# Patient Record
Sex: Female | Born: 1990 | Race: Black or African American | Hispanic: No | Marital: Single | Smoking: Never smoker
Health system: Southern US, Community
[De-identification: ages and names within clinical notes are randomized; demographics above are authoritative.]

## PROBLEM LIST (undated history)

## (undated) DIAGNOSIS — L732 Hidradenitis suppurativa: Secondary | ICD-10-CM

---

## 2009-10-28 HISTORY — PX: OTHER SURGICAL HISTORY: SHX169

## 2014-05-19 ENCOUNTER — Encounter (HOSPITAL_COMMUNITY): Payer: Self-pay | Admitting: Emergency Medicine

## 2014-05-19 ENCOUNTER — Emergency Department (HOSPITAL_COMMUNITY)
Admission: EM | Admit: 2014-05-19 | Discharge: 2014-05-19 | Disposition: A | Discharge status: Home / Self Care | Payer: BC Managed Care – PPO | Attending: Emergency Medicine | Admitting: Emergency Medicine

## 2014-05-19 DIAGNOSIS — L509 Urticaria, unspecified: Secondary | ICD-10-CM | POA: Insufficient documentation

## 2014-05-19 DIAGNOSIS — R0989 Other specified symptoms and signs involving the circulatory and respiratory systems: Secondary | ICD-10-CM | POA: Insufficient documentation

## 2014-05-19 DIAGNOSIS — R0609 Other forms of dyspnea: Secondary | ICD-10-CM | POA: Insufficient documentation

## 2014-05-19 DIAGNOSIS — T7840XA Allergy, unspecified, initial encounter: Secondary | ICD-10-CM

## 2014-05-19 DIAGNOSIS — Z79899 Other long term (current) drug therapy: Secondary | ICD-10-CM | POA: Insufficient documentation

## 2014-05-19 DIAGNOSIS — R1310 Dysphagia, unspecified: Secondary | ICD-10-CM | POA: Insufficient documentation

## 2014-05-19 DIAGNOSIS — R21 Rash and other nonspecific skin eruption: Secondary | ICD-10-CM | POA: Insufficient documentation

## 2014-05-19 DIAGNOSIS — T498X5A Adverse effect of other topical agents, initial encounter: Secondary | ICD-10-CM | POA: Insufficient documentation

## 2014-05-19 DIAGNOSIS — J029 Acute pharyngitis, unspecified: Secondary | ICD-10-CM | POA: Insufficient documentation

## 2014-05-19 MED ORDER — PREDNISONE 20 MG PO TABS
60.0000 mg | ORAL_TABLET | Freq: Once | ORAL | Status: AC
  Administered 2014-05-19: 60 mg via ORAL
  Filled 2014-05-19: qty 3

## 2014-05-19 MED ORDER — PREDNISONE 20 MG PO TABS: 40.0000 mg | ORAL_TABLET | Freq: Once | ORAL | Status: DC

## 2014-05-19 MED ORDER — FAMOTIDINE 20 MG PO TABS
20.0000 mg | ORAL_TABLET | Freq: Once | ORAL | Status: AC
  Administered 2014-05-19: 20 mg via ORAL
  Filled 2014-05-19: qty 1

## 2014-05-19 MED ORDER — FAMOTIDINE 20 MG PO TABS: 20.0000 mg | ORAL_TABLET | Freq: Two times a day (BID) | ORAL | Status: DC

## 2014-05-19 NOTE — ED Notes (Addendum)
Pt reports having generalized rash that started two days ago. Pt reports the rash has decreased, however it is now on the feet, hands, and arms. Pt also reports a sore throat. Pt is A/O x4, in NAD,and vitals are WDL. Pt reports itching makes the rash and associated pain worse. Pt reports taking benadryl, however the effect wears off.

## 2014-05-19 NOTE — ED Provider Notes (Signed)
CSN: [id number]     Arrival date & time 05/19/14  2024 History  This chart was scribed for Earley FavorGail Sherrel Shafer, NP, working with Toy BakerAnthony T Allen, MD by Chestine SporeSoijett Blue, ED Scribe. The patient was seen in room WTR6/WTR6 at 9:13 PM.     Chief Complaint  Patient presents with  . Rash  . Sore Throat     The history is provided by the patient. No language interpreter was used.   Mary HawsRachel Arnold is a 23 y.o. female who was brought in by parents to the ED complaining of a rash onset 2 days ago. She states that they will go away but when she scratches them, they come back. She states that she has not tried any new laundry detergent, makeup, or any new items in her daily life. She states that the rash had decreased, but it is now on her feet, hands, and arms. She states that itching makes the rash and the pain worse. She states that she is also having a sore throat. She states that she is having associated symptoms of trouble swallowing and difficulty breathing. She states that she has taken Benadryl with no relief for her symptoms. She states that she took IBU 2 nights ago for her recurring boils that she has. She states that she took more IBU last night and the hives came back. She denies any other associated symptoms.   History reviewed. No pertinent past medical history. History reviewed. No pertinent past surgical history. No family history on file. History  Substance Use Topics  . Smoking status: Never Smoker   . Smokeless tobacco: Never Used  . Alcohol Use: No   OB History   Grav Para Term Preterm Abortions TAB SAB Ect Mult Living                 Review of Systems  HENT: Positive for sore throat and trouble swallowing.   Respiratory:       Difficulty breathing.  Skin: Positive for rash (feet, hands, and arms).  All other systems reviewed and are negative.    Allergies  Ibuprofen  Home Medications   Prior to Admission medications   Medication Sig Start Date End Date Taking? Authorizing  Provider  diphenhydrAMINE (BENADRYL) 25 MG tablet Take 25 mg by mouth every 6 (six) hours as needed (hives).   Yes Historical Provider, MD  ibuprofen (ADVIL,MOTRIN) 200 MG tablet Take 400 mg by mouth every 6 (six) hours as needed (pain).   Yes Historical Provider, MD  levonorgestrel-ethinyl estradiol (AVIANE) 0.1-20 MG-MCG tablet Take 1 tablet by mouth daily.   Yes Historical Provider, MD   BP 140/78  Pulse 98  Temp(Src) 98.4 F (36.9 C) (Oral)  Resp 18  SpO2 100%  LMP 05/05/2014  Physical Exam  Nursing note and vitals reviewed. Constitutional: She is oriented to person, place, and time. She appears well-developed and well-nourished. No distress.  HENT:  Head: Normocephalic and atraumatic.  Eyes: EOM are normal.  Neck: Neck supple. No tracheal deviation present.  Cardiovascular: Normal rate.   Pulmonary/Chest: Effort normal. No respiratory distress.  Musculoskeletal: Normal range of motion.  Neurological: She is alert and oriented to person, place, and time.  Skin: Skin is warm and dry. Rash noted.  Diffused generalized Urticaria present.   Psychiatric: She has a normal mood and affect. Her behavior is normal.    ED Course  Procedures (including critical care time) DIAGNOSTIC STUDIES: Oxygen Saturation is 100% on room air, normal by my interpretation.  COORDINATION OF CARE: 9:19 PM-Discussed treatment plan which includes Prednisone and Pepcid with pt at bedside and pt agreed to plan.   Labs Review Labs Reviewed - No data to display  Imaging Review No results found.   EKG Interpretation None      MDM  Will give Rx for Prednisone and Pepcid for suspected allergic reaction to Ibuprofen  Final diagnoses:  None       I personally performed the services described in this documentation, which was scribed in my presence. The recorded information has been reviewed and is accurate.    Arman Filter, NP 05/19/14 2140

## 2014-05-19 NOTE — ED Provider Notes (Signed)
Medical screening examination/treatment/procedure(s) were performed by non-physician practitioner and as supervising physician I was immediately available for consultation/collaboration.  Chaos Carlile T Offie Waide, MD 05/19/14 2141 

## 2016-04-16 ENCOUNTER — Encounter (HOSPITAL_COMMUNITY): Payer: Self-pay | Admitting: Emergency Medicine

## 2016-04-16 ENCOUNTER — Other Ambulatory Visit: Payer: Self-pay

## 2016-04-16 ENCOUNTER — Emergency Department (HOSPITAL_COMMUNITY): Payer: BC Managed Care – PPO

## 2016-04-16 ENCOUNTER — Emergency Department (HOSPITAL_COMMUNITY)
Admission: EM | Admit: 2016-04-16 | Discharge: 2016-04-17 | Disposition: A | Discharge status: Home / Self Care | Payer: BC Managed Care – PPO | Attending: Emergency Medicine | Admitting: Emergency Medicine

## 2016-04-16 DIAGNOSIS — D649 Anemia, unspecified: Secondary | ICD-10-CM | POA: Insufficient documentation

## 2016-04-16 DIAGNOSIS — R0602 Shortness of breath: Secondary | ICD-10-CM | POA: Diagnosis present

## 2016-04-16 HISTORY — DX: Hidradenitis suppurativa: L73.2

## 2016-04-16 MED ORDER — SODIUM CHLORIDE 0.9 % IV BOLUS (SEPSIS)
1000.0000 mL | Freq: Once | INTRAVENOUS | Status: AC
  Administered 2016-04-16: 1000 mL via INTRAVENOUS

## 2016-04-16 NOTE — ED Provider Notes (Signed)
CSN: [id number]     Arrival date & time 04/16/16  1948 History   First MD Initiated Contact with Patient 04/16/16 2236     Chief Complaint  Patient presents with  . Palpitations  . Shortness of Breath     (Consider location/radiation/quality/duration/timing/severity/associated sxs/prior Treatment) Patient is a 25 y.o. female presenting with palpitations.  Palpitations Timing:  Constant Chronicity:  New Context: dehydration   Context: not anxiety and not blood loss   Relieved by:  None tried Worsened by:  Nothing Ineffective treatments:  None tried Associated symptoms: shortness of breath   Associated symptoms: no back pain, no chest pain, no cough, no lower extremity edema and no PND     Past Medical History  Diagnosis Date  . Hydradenitis    Past Surgical History  Procedure Laterality Date  . Sweat gland removal Bilateral 2011    for hydradentitis   History reviewed. No pertinent family history. Social History  Substance Use Topics  . Smoking status: Never Smoker   . Smokeless tobacco: Never Used  . Alcohol Use: Yes   OB History    No data available     Review of Systems  Constitutional: Negative for fever and chills.  Eyes: Negative for pain.  Respiratory: Positive for shortness of breath. Negative for cough.   Cardiovascular: Positive for palpitations. Negative for chest pain and PND.  Endocrine: Negative for polydipsia and polyuria.  Genitourinary: Negative for flank pain.  Musculoskeletal: Negative for back pain.  All other systems reviewed and are negative.     Allergies  Review of patient's allergies indicates no known allergies.  Home Medications   Prior to Admission medications   Medication Sig Start Date End Date Taking? Authorizing Provider  ferrous sulfate 325 (65 FE) MG tablet Take 1 tablet (325 mg total) by mouth daily. 04/17/16   Marily Memos, MD  norgestimate-ethinyl estradiol (SPRINTEC 28) 0.25-35 MG-MCG tablet Take 1 tablet by mouth  daily. 04/17/16   Marily Memos, MD   BP 116/76 mmHg  Pulse 78  Temp(Src) 99.5 F (37.5 C) (Oral)  Resp 20  SpO2 100%  LMP 04/02/2016 (Approximate) Physical Exam  Constitutional: She is oriented to person, place, and time. She appears well-developed and well-nourished.  HENT:  Head: Normocephalic and atraumatic.  Neck: Normal range of motion.  Cardiovascular: Normal rate and regular rhythm.   Pulmonary/Chest: Effort normal. No stridor. No respiratory distress.  Abdominal: Soft. She exhibits no distension. There is no tenderness.  Musculoskeletal: She exhibits no edema or tenderness.  Neurological: She is alert and oriented to person, place, and time.  Skin: Skin is warm and dry.  Nursing note and vitals reviewed.   ED Course  Procedures (including critical care time) Labs Review Labs Reviewed  CBC WITH DIFFERENTIAL/PLATELET - Abnormal; Notable for the following:    WBC 10.9 (*)    Hemoglobin 8.2 (*)    HCT 27.9 (*)    MCV 60.0 (*)    MCH 17.6 (*)    MCHC 29.4 (*)    RDW 19.3 (*)    Platelets 712 (*)    All other components within normal limits  COMPREHENSIVE METABOLIC PANEL - Abnormal; Notable for the following:    Total Protein 8.8 (*)    AST 12 (*)    ALT 11 (*)    All other components within normal limits  URINALYSIS, ROUTINE W REFLEX MICROSCOPIC (NOT AT [facility]) - Abnormal; Notable for the following:    Leukocytes, UA SMALL (*)  All other components within normal limits  URINE MICROSCOPIC-ADD ON - Abnormal; Notable for the following:    Squamous Epithelial / LPF 0-5 (*)    All other components within normal limits  TSH  VITAMIN B12  FOLATE  IRON AND TIBC  FERRITIN  RETICULOCYTES    Imaging Review Dg Chest 2 View  04/16/2016  CLINICAL DATA:  Shortness of breath and palpitations intermittently for 2 days. EXAM: CHEST  2 VIEW COMPARISON:  None. FINDINGS: The cardiomediastinal silhouette is within normal limits. The lungs are well inflated and clear. There is no  evidence of pleural effusion or pneumothorax. No acute osseous abnormality is identified. IMPRESSION: No active cardiopulmonary disease. Electronically Signed   By: Sebastian AcheAllen  Grady M.D.   On: 04/16/2016 20:53   I have personally reviewed and evaluated these images and lab results as part of my medical decision-making.   EKG Interpretation   Date/Time:  Tuesday April 16 2016 20:21:48 EDT Ventricular Rate:  91 PR Interval:    QRS Duration: 83 QT Interval:  346 QTC Calculation: 426 R Axis:   74 Text Interpretation:  Sinus rhythm Borderline T abnormalities, anterior  leads No old tracing to compare Confirmed by Ethelda ChickJACUBOWITZ  MD, SAM [phone]([id number])  on 04/16/2016 8:28:31 PM      MDM   Final diagnoses:  Anemia, unspecified anemia type   PERC negative. Slightly anemic, but not requiring transfusion.   Anemia is likely secondary to heavy periods. Will start Her on her birth control. We'll also start on iron supplementation at this time. Ask her to follow-up with primary doctor in 1-2 weeks for recheck of hemoglobin.  New Prescriptions: New Prescriptions   FERROUS SULFATE 325 (65 FE) MG TABLET    Take 1 tablet (325 mg total) by mouth daily.   NORGESTIMATE-ETHINYL ESTRADIOL (SPRINTEC 28) 0.25-35 MG-MCG TABLET    Take 1 tablet by mouth daily.     I have personally and contemperaneously reviewed labs and imaging and used in my decision making as above.   A medical screening exam was performed and I feel the patient has had an appropriate workup for their chief complaint at this time and likelihood of emergent condition existing is low and thus workup can continue on an outpatient basis.. Their vital signs are stable. They have been counseled on decision, discharge, follow up and which symptoms necessitate immediate return to the emergency department.  They verbally stated understanding and agreement with plan and discharged in stable condition.       Marily MemosJason Jerred Zaremba, MD 04/17/16 0157

## 2016-04-16 NOTE — ED Notes (Signed)
Lying: 113/68; pulse rate: 85 bpm Sitting: 120/79; pulse rate:85 bpm Standing: 113/82; pulse rate: 82 bpm

## 2016-04-16 NOTE — ED Notes (Signed)
Pt states that she has had palpitations, SOB and some chest pain x 3-4 days. States it gets worse when she eats and drinks. Alert and oriented.

## 2016-04-17 LAB — URINALYSIS, ROUTINE W REFLEX MICROSCOPIC
BILIRUBIN URINE: NEGATIVE
GLUCOSE, UA: NEGATIVE mg/dL
Hgb urine dipstick: NEGATIVE
Ketones, ur: NEGATIVE mg/dL
Nitrite: NEGATIVE
Protein, ur: NEGATIVE mg/dL
Specific Gravity, Urine: 1.02 (ref 1.005–1.030)
pH: 6 (ref 5.0–8.0)

## 2016-04-17 LAB — URINE MICROSCOPIC-ADD ON
Bacteria, UA: NONE SEEN
RBC / HPF: NONE SEEN RBC/hpf (ref 0–5)

## 2016-04-17 LAB — FOLATE: Folate: 12.7 ng/mL (ref >5.9)

## 2016-04-17 LAB — COMPREHENSIVE METABOLIC PANEL
ALK PHOS: 77 U/L (ref 38–126)
ALT: 11 U/L — AB (ref 14–54)
AST: 12 U/L — AB (ref 15–41)
Albumin: 3.6 g/dL (ref 3.5–5.0)
Anion gap: 6 (ref 5–15)
BUN: 11 mg/dL (ref 6–20)
CHLORIDE: 109 mmol/L (ref 101–111)
CO2: 24 mmol/L (ref 22–32)
CREATININE: 0.72 mg/dL (ref 0.44–1.00)
Calcium: 9.2 mg/dL (ref 8.9–10.3)
GFR calc Af Amer: >60 mL/min (ref >60)
GFR calc non Af Amer: >60 mL/min (ref >60)
Glucose, Bld: 83 mg/dL (ref 65–99)
Potassium: 3.8 mmol/L (ref 3.5–5.1)
SODIUM: 139 mmol/L (ref 135–145)
Total Bilirubin: 0.3 mg/dL (ref 0.3–1.2)
Total Protein: 8.8 g/dL — ABNORMAL HIGH (ref 6.5–8.1)

## 2016-04-17 LAB — CBC WITH DIFFERENTIAL/PLATELET
Basophils Absolute: 0 10*3/uL (ref 0.0–0.1)
Basophils Relative: 0 %
EOS ABS: 0.3 10*3/uL (ref 0.0–0.7)
Eosinophils Relative: 3 %
HCT: 27.9 % — ABNORMAL LOW (ref 36.0–46.0)
HEMOGLOBIN: 8.2 g/dL — AB (ref 12.0–15.0)
Lymphocytes Relative: 27 %
Lymphs Abs: 2.9 10*3/uL (ref 0.7–4.0)
MCH: 17.6 pg — ABNORMAL LOW (ref 26.0–34.0)
MCHC: 29.4 g/dL — ABNORMAL LOW (ref 30.0–36.0)
MCV: 60 fL — ABNORMAL LOW (ref 78.0–100.0)
MONO ABS: 0.8 10*3/uL (ref 0.1–1.0)
MONOS PCT: 7 %
Neutro Abs: 6.9 10*3/uL (ref 1.7–7.7)
Neutrophils Relative %: 63 %
Platelets: 712 10*3/uL — ABNORMAL HIGH (ref 150–400)
RBC: 4.65 MIL/uL (ref 3.87–5.11)
RDW: 19.3 % — ABNORMAL HIGH (ref 11.5–15.5)
WBC: 10.9 10*3/uL — ABNORMAL HIGH (ref 4.0–10.5)

## 2016-04-17 LAB — RETICULOCYTES
RBC.: 4.58 MIL/uL (ref 3.87–5.11)
Retic Count, Absolute: 64.1 10*3/uL (ref 19.0–186.0)
Retic Ct Pct: 1.4 % (ref 0.4–3.1)

## 2016-04-17 LAB — IRON AND TIBC
Iron: 11 ug/dL — ABNORMAL LOW (ref 28–170)
SATURATION RATIOS: 3 % — AB (ref 10.4–31.8)
TIBC: 318 ug/dL (ref 250–450)
UIBC: 307 ug/dL

## 2016-04-17 LAB — TSH: TSH: 1.757 u[IU]/mL (ref 0.350–4.500)

## 2016-04-17 LAB — VITAMIN B12: VITAMIN B 12: 1232 pg/mL — AB (ref 180–914)

## 2016-04-17 LAB — FERRITIN: Ferritin: 9 ng/mL — ABNORMAL LOW (ref 11–307)

## 2016-04-17 MED ORDER — FERROUS SULFATE 325 (65 FE) MG PO TABS: 325.0000 mg | ORAL_TABLET | Freq: Every day | ORAL | Status: DC

## 2016-04-17 MED ORDER — NORGESTIMATE-ETH ESTRADIOL 0.25-35 MG-MCG PO TABS: 1.0000 | ORAL_TABLET | Freq: Every day | ORAL | Status: DC

## 2017-02-25 ENCOUNTER — Emergency Department (HOSPITAL_COMMUNITY)
Admission: EM | Admit: 2017-02-25 | Discharge: 2017-02-25 | Disposition: A | Discharge status: Home / Self Care | Payer: BC Managed Care – PPO | Attending: Emergency Medicine | Admitting: Emergency Medicine

## 2017-02-25 ENCOUNTER — Encounter (HOSPITAL_COMMUNITY): Payer: Self-pay | Admitting: *Deleted

## 2017-02-25 DIAGNOSIS — Y939 Activity, unspecified: Secondary | ICD-10-CM | POA: Insufficient documentation

## 2017-02-25 DIAGNOSIS — H5712 Ocular pain, left eye: Secondary | ICD-10-CM

## 2017-02-25 DIAGNOSIS — Y929 Unspecified place or not applicable: Secondary | ICD-10-CM | POA: Insufficient documentation

## 2017-02-25 DIAGNOSIS — Y999 Unspecified external cause status: Secondary | ICD-10-CM | POA: Insufficient documentation

## 2017-02-25 DIAGNOSIS — S0502XA Injury of conjunctiva and corneal abrasion without foreign body, left eye, initial encounter: Secondary | ICD-10-CM | POA: Insufficient documentation

## 2017-02-25 DIAGNOSIS — X58XXXA Exposure to other specified factors, initial encounter: Secondary | ICD-10-CM | POA: Insufficient documentation

## 2017-02-25 MED ORDER — TETRACAINE HCL 0.5 % OP SOLN
2.0000 [drp] | Freq: Once | OPHTHALMIC | Status: AC
  Administered 2017-02-25: 2 [drp] via OPHTHALMIC
  Filled 2017-02-25: qty 4

## 2017-02-25 MED ORDER — FLUORESCEIN SODIUM 0.6 MG OP STRP
1.0000 | ORAL_STRIP | Freq: Once | OPHTHALMIC | Status: AC
  Administered 2017-02-25: 1 via OPHTHALMIC
  Filled 2017-02-25: qty 1

## 2017-02-25 MED ORDER — ERYTHROMYCIN 5 MG/GM OP OINT
TOPICAL_OINTMENT | Freq: Once | OPHTHALMIC | Status: AC
  Administered 2017-02-25: 19:00:00 via OPHTHALMIC
  Filled 2017-02-25: qty 3.5

## 2017-02-25 NOTE — ED Provider Notes (Signed)
[facility]-EMERGENCY DEPT Provider Note   CSN: [id number] Arrival date & time: 02/25/17  1613  By signing my name below, I, Freida Busman, attest that this documentation has been prepared under the direction and in the presence of [facility], PA-C. Electronically Signed: Freida Busman, Scribe. 02/25/2017. 5:50 PM.  History   Chief Complaint Chief Complaint  Patient presents with  . Eye Pain   The history is provided by the patient. No language interpreter was used.   HPI Comments:  Mary Arnold is a 26 y.o. female who presents to the Emergency Department complaining of constant redness to the left eye x 1.5 weeks. She believes a  foreign body entered the eye 1.5 weeks ago. She is  unsure what entered the eye but states the next day she woke up with redness and continues to have a foreign body sensation. She reports associated swelling of the  left eyelid since last night, mild achy pain to the eye, and photophobia that has improved. She also notes mild vision change, slight milky discharge from the eye, and rhinorrhea. She has applied OTC red eye drops with mild relief. She denies use of contact lenses. Also denies fever, sore throat, ear pain/fullness, and sinus pressure. No h/o past eye issues or recent trauma to the eye.   Past Medical History:  Diagnosis Date  . Hydradenitis     There are no active problems to display for this patient.   Past Surgical History:  Procedure Laterality Date  . sweat gland removal Bilateral 2011   for hydradentitis    OB History    No data available       Home Medications    Prior to Admission medications   Medication Sig Start Date End Date Taking? Authorizing Provider  ferrous sulfate 325 (65 FE) MG tablet Take 1 tablet (325 mg total) by mouth daily. 04/17/16   Marily Memos, MD  norgestimate-ethinyl estradiol (SPRINTEC 28) 0.25-35 MG-MCG tablet Take 1 tablet by mouth daily. 04/17/16   Marily Memos, MD    Family History History reviewed. No  pertinent family history.  Social History Social History  Substance Use Topics  . Smoking status: Never Smoker  . Smokeless tobacco: Never Used  . Alcohol use Yes     Allergies   Patient has no known allergies.   Review of Systems Review of Systems  Constitutional: Negative for fever.  HENT: Positive for rhinorrhea. Negative for ear pain, sinus pressure and sore throat.   Eyes: Positive for photophobia, pain, discharge, redness and visual disturbance.     Physical Exam Updated Vital Signs BP 113/82 (BP Location: Left Arm)   Pulse 79   Temp 98.2 F (36.8 C) (Oral)   Resp 14   Ht  (1.6 m)   LMP 01/30/2017   SpO2 100%   Physical Exam  Constitutional: She appears well-developed and well-nourished. No distress.  HENT:  Head: Normocephalic and atraumatic.  Eyes:  PERRL. EOM intact. Fluorescein uptake to left eye c/w abrasion.   Neck: Neck supple.  Cardiovascular: Normal rate, regular rhythm and normal heart sounds.   No murmur heard. Pulmonary/Chest: Effort normal and breath sounds normal. No respiratory distress. She has no wheezes. She has no rales.  Musculoskeletal: Normal range of motion.  Neurological: She is alert.  Skin: Skin is warm and dry.  Nursing note and vitals reviewed.    ED Treatments / Results   DIAGNOSTIC STUDIES:  Oxygen Saturation is 100% on RA, normal by my interpretation.  COORDINATION OF CARE:  5:50 PM Discussed treatment plan with pt at bedside and pt agreed to plan.  Labs (all labs ordered are listed, but only abnormal results are displayed) Labs Reviewed - No data to display  EKG  EKG Interpretation None       Radiology No results found.  Procedures Procedures (including critical care time)  Medications Ordered in ED Medications  tetracaine (PONTOCAINE) 0.5 % ophthalmic solution 2 drop (2 drops Left Eye Given by Other 02/25/17 1923)  fluorescein ophthalmic strip 1 strip (1 strip Left Eye Given by Other 02/25/17  1923)  erythromycin ophthalmic ointment ( Left Eye Given 02/25/17 1923)     Initial Impression / Assessment and Plan / ED Course  I have reviewed the triage vital signs and the nursing notes.  Pertinent labs & imaging results that were available during my care of the patient were reviewed by me and considered in my medical decision making (see chart for details).     Mary Arnold is a 26 y.o. female who presents to ED for left eye pain and foreign body sensation. Normal visual acuity. Pt with corneal abrasion on exam. No evidence of foreign body. Exam not concerning for orbital cellulitis, hyphema. No concern for uveitis. Patient will be discharged home with erythromycin ointment. Patient understands to follow up with ophthalmology - agrees to call in the morning; return precautions discussed. Patient appears safe for discharged.    Final Clinical Impressions(s) / ED Diagnoses   Final diagnoses:  Left eye pain  Abrasion of left cornea, initial encounter    New Prescriptions Discharge Medication List as of 02/25/2017  7:39 PM     I personally performed the services described in this documentation, which was scribed in my presence. The recorded information has been reviewed and is accurate.     [facility], PA-C 02/25/17 2107    Vanetta Mulders, MD 02/27/17 2055

## 2017-02-25 NOTE — ED Notes (Signed)
Pt states difficulty seeing out of right eye. She states she has been using redness relief eye drops, but the eye has not felt better. She states she has difficulty driving at night and things are blurry just in that eye.

## 2017-02-25 NOTE — Discharge Instructions (Signed)
It was my pleasure taking care of you today!  Apply antibiotic ointment to the affected eye 4x per day.   Return to ER for new or worsening symptoms, any additional concerns.   IT IS VERY IMPORTANT THAT YOU CALL THE EYE DOCTOR LISTED BELOW AND SEE THE TOMORROW!  Dr. Sherryll Burger [address] [place], [place] [address] Local: [phone] Toll-free: [phone] Fax: [phone]

## 2017-02-25 NOTE — ED Triage Notes (Signed)
Patient c/o left eye pain with drainage x1 week. Ambulatory to triage.

## 2017-07-19 ENCOUNTER — Emergency Department (HOSPITAL_COMMUNITY)
Admission: EM | Admit: 2017-07-19 | Discharge: 2017-07-19 | Disposition: A | Discharge status: Home / Self Care | Payer: BC Managed Care – PPO | Attending: Emergency Medicine | Admitting: Emergency Medicine

## 2017-07-19 ENCOUNTER — Encounter (HOSPITAL_COMMUNITY): Payer: Self-pay | Admitting: Emergency Medicine

## 2017-07-19 DIAGNOSIS — J029 Acute pharyngitis, unspecified: Secondary | ICD-10-CM | POA: Insufficient documentation

## 2017-07-19 DIAGNOSIS — Z79899 Other long term (current) drug therapy: Secondary | ICD-10-CM | POA: Insufficient documentation

## 2017-07-19 DIAGNOSIS — J028 Acute pharyngitis due to other specified organisms: Secondary | ICD-10-CM

## 2017-07-19 DIAGNOSIS — B9789 Other viral agents as the cause of diseases classified elsewhere: Secondary | ICD-10-CM | POA: Insufficient documentation

## 2017-07-19 LAB — RAPID STREP SCREEN (MED CTR MEBANE ONLY): STREPTOCOCCUS, GROUP A SCREEN (DIRECT): NEGATIVE

## 2017-07-19 MED ORDER — LIDOCAINE VISCOUS 2 % MT SOLN: 10.0000 mL | OROMUCOSAL | 0 refills | Status: DC | PRN

## 2017-07-19 MED ORDER — LIDOCAINE VISCOUS 2 % MT SOLN
15.0000 mL | Freq: Once | OROMUCOSAL | Status: AC
  Administered 2017-07-19: 15 mL via OROMUCOSAL
  Filled 2017-07-19: qty 15

## 2017-07-19 NOTE — Discharge Instructions (Signed)
Take tylenol as needed for pain. Return for any problems.

## 2017-07-19 NOTE — ED Triage Notes (Addendum)
Patient c/o sore throat x2 weeks and cough with congestion since today. Denies N/V/D.

## 2017-07-19 NOTE — ED Provider Notes (Signed)
[facility]-EMERGENCY DEPT Provider Note   CSN: [id number] Arrival date & time: 07/19/17  1535     History   Chief Complaint Chief Complaint  Patient presents with  . Sore Throat    HPI Mary Arnold is a 26 y.o. female who presents to the ED with sore throat, congestion and cough that started a week ago. She has taken OTC medication without relief.   The history is provided by the patient. No language interpreter was used.  URI   This is a new problem. The current episode started more than 1 week ago. The problem has been gradually worsening. There has been no fever. Associated symptoms include congestion, rhinorrhea, sneezing, sore throat, swollen glands and cough. Pertinent negatives include no abdominal pain, no diarrhea, no nausea, no vomiting, no dysuria, no headaches, no sinus pain, no rash and no wheezing. She has tried other medications for the symptoms.    Past Medical History:  Diagnosis Date  . Hydradenitis     There are no active problems to display for this patient.   Past Surgical History:  Procedure Laterality Date  . sweat gland removal Bilateral 2011   for hydradentitis    OB History    No data available       Home Medications    Prior to Admission medications   Medication Sig Start Date End Date Taking? Authorizing Provider  ferrous sulfate 325 (65 FE) MG tablet Take 1 tablet (325 mg total) by mouth daily. 04/17/16   Mesner, Barbara Cower, MD  lidocaine (XYLOCAINE) 2 % solution Use as directed 10 mLs in the mouth or throat every 4 (four) hours as needed for mouth pain. 07/19/17   Janne Napoleon, NP  norgestimate-ethinyl estradiol (SPRINTEC 28) 0.25-35 MG-MCG tablet Take 1 tablet by mouth daily. 04/17/16   Mesner, Barbara Cower, MD    Family History History reviewed. No pertinent family history.  Social History Social History  Substance Use Topics  . Smoking status: Never Smoker  . Smokeless tobacco: Never Used  . Alcohol use Yes     Allergies   Patient has no  known allergies.   Review of Systems Review of Systems  Constitutional: Negative for chills and fever.  HENT: Positive for congestion, rhinorrhea, sneezing and sore throat. Negative for sinus pain.   Eyes: Negative for visual disturbance.  Respiratory: Positive for cough. Negative for wheezing.   Gastrointestinal: Negative for abdominal pain, diarrhea, nausea and vomiting.  Genitourinary: Negative for dysuria.  Musculoskeletal: Positive for myalgias. Negative for neck stiffness.  Skin: Negative for rash.  Neurological: Negative for syncope and headaches.  Psychiatric/Behavioral: Negative for confusion.     Physical Exam Updated Vital Signs BP (!) 125/94   Pulse 93   Temp 98.4 F (36.9 C) (Oral)   Resp 20   LMP 07/19/2017   SpO2 100%   Physical Exam  Constitutional: She is oriented to person, place, and time. She appears well-developed and well-nourished. No distress.  HENT:  Head: Normocephalic and atraumatic.  Right Ear: Tympanic membrane normal.  Left Ear: Tympanic membrane normal.  Nose: Rhinorrhea present.  Mouth/Throat: Uvula is midline and mucous membranes are normal. No uvula swelling. Posterior oropharyngeal erythema present. No posterior oropharyngeal edema.  There are tiny ulcer areas to the posterior pharynx.   Eyes: Pupils are equal, round, and reactive to light. EOM are normal.  Neck: Neck supple.  Cardiovascular: Normal rate.   Pulmonary/Chest: Effort normal.  Abdominal: Soft. There is no tenderness.  Musculoskeletal: Normal range of motion.  Lymphadenopathy:    She has cervical adenopathy.  Neurological: She is alert and oriented to person, place, and time. No cranial nerve deficit.  Skin: Skin is warm and dry.  Psychiatric: She has a normal mood and affect. Her behavior is normal.  Nursing note and vitals reviewed.    ED Treatments / Results  Labs (all labs ordered are listed, but only abnormal results are displayed) Labs Reviewed  RAPID STREP  SCREEN (NOT AT [facility])  CULTURE, GROUP A STREP [facility])   Radiology No results found.  Procedures Procedures (including critical care time)  Medications Ordered in ED Medications  lidocaine (XYLOCAINE) 2 % viscous mouth solution 15 mL (15 mLs Mouth/Throat Given 07/19/17 1907)     Initial Impression / Assessment and Plan / ED Course  I have reviewed the triage vital signs and the nursing notes.   Pt afebrile without tonsillar exudate, negative strep. Presents with mild cervical lymphadenopathy, & dysphagia; diagnosis of viral pharyngitis. No abx indicated. Discharged with symptomatic tx for pain  Pt does not appear dehydrated, but did discuss importance of water rehydration. Presentation non concerning for PTA or RPA. No trismus or uvula deviation. Specific return precautions discussed. Pt able to drink water in ED without difficulty with intact air way. Recommended PCP follow up.  Final Clinical Impressions(s) / ED Diagnoses   Final diagnoses:  Pharyngitis due to other organism    New Prescriptions Discharge Medication List as of 07/19/2017  6:30 PM    START taking these medications   Details  lidocaine (XYLOCAINE) 2 % solution Use as directed 10 mLs in the mouth or throat every 4 (four) hours as needed for mouth pain., Starting Sat 07/19/2017, Print         Balta, [place], [place] 07/20/17 Jacinta Shoe    Lorre Nick, MD 07/24/17 1134

## 2017-07-22 LAB — CULTURE, GROUP A STREP (THRC)

## 2017-09-11 ENCOUNTER — Emergency Department (HOSPITAL_COMMUNITY): Payer: No Typology Code available for payment source

## 2017-09-11 ENCOUNTER — Encounter (HOSPITAL_COMMUNITY): Payer: Self-pay | Admitting: Emergency Medicine

## 2017-09-11 ENCOUNTER — Other Ambulatory Visit: Payer: Self-pay

## 2017-09-11 ENCOUNTER — Emergency Department (HOSPITAL_COMMUNITY)
Admission: EM | Admit: 2017-09-11 | Discharge: 2017-09-11 | Disposition: A | Discharge status: Home / Self Care | Payer: No Typology Code available for payment source | Attending: Emergency Medicine | Admitting: Emergency Medicine

## 2017-09-11 DIAGNOSIS — Y929 Unspecified place or not applicable: Secondary | ICD-10-CM | POA: Insufficient documentation

## 2017-09-11 DIAGNOSIS — R1084 Generalized abdominal pain: Secondary | ICD-10-CM | POA: Insufficient documentation

## 2017-09-11 DIAGNOSIS — Y999 Unspecified external cause status: Secondary | ICD-10-CM | POA: Diagnosis not present

## 2017-09-11 DIAGNOSIS — Y9389 Activity, other specified: Secondary | ICD-10-CM | POA: Diagnosis not present

## 2017-09-11 DIAGNOSIS — R51 Headache: Secondary | ICD-10-CM | POA: Insufficient documentation

## 2017-09-11 DIAGNOSIS — R079 Chest pain, unspecified: Secondary | ICD-10-CM | POA: Diagnosis present

## 2017-09-11 DIAGNOSIS — R110 Nausea: Secondary | ICD-10-CM | POA: Diagnosis not present

## 2017-09-11 DIAGNOSIS — R0789 Other chest pain: Secondary | ICD-10-CM | POA: Diagnosis not present

## 2017-09-11 DIAGNOSIS — M542 Cervicalgia: Secondary | ICD-10-CM | POA: Diagnosis not present

## 2017-09-11 LAB — POC URINE PREG, ED: Preg Test, Ur: NEGATIVE

## 2017-09-11 MED ORDER — NAPROXEN 500 MG PO TABS: 500.0000 mg | ORAL_TABLET | Freq: Two times a day (BID) | ORAL | 0 refills | Status: AC

## 2017-09-11 MED ORDER — METHOCARBAMOL 500 MG PO TABS: 500.0000 mg | ORAL_TABLET | Freq: Two times a day (BID) | ORAL | 0 refills | Status: AC
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
